# Patient Record
Sex: Female | Born: 2000 | Hispanic: Yes | Marital: Single | Smoking: Never smoker
Health system: Southern US, Community
[De-identification: ages and names within clinical notes are randomized; demographics above are authoritative.]

---
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
## 2024-12-01 ENCOUNTER — Ambulatory Visit

## 2024-12-01 DIAGNOSIS — L700 Acne vulgaris: Secondary | ICD-10-CM

## 2024-12-01 MED ORDER — CLINDAMYCIN PHOS-BENZOYL PEROX 1-5 % EX GEL: CUTANEOUS | 5 refills | Status: AC

## 2024-12-01 MED ORDER — DOXYCYCLINE MONOHYDRATE 100 MG PO TABS: 100.0000 mg | ORAL_TABLET | Freq: Two times a day (BID) | ORAL | 2 refills | Status: AC

## 2024-12-01 MED ORDER — ADAPALENE 0.1 % EX GEL: Freq: Every day | CUTANEOUS | 5 refills | Status: AC

## 2024-12-01 NOTE — Patient Instructions (Addendum)
 Plan for Acne  In the morning: - Cleanse face with a gentle cleanser OR benzoyl peroxide wash if instructed to use this at your visit(can be purchased over the counter- examples at the bottom) - Wipe face with clindamycin  wipe if prescribed at your visit or use benzaclin. This can be used on entire face/chest/back or as a spot treatment to active acne areas  - Apply an oil-free moisturizer  In the evening: - Cleanse face with a regular gentle face wash - Wait for skin to completely dry - Apply a pea sized amount of your retinoid (tretinoin or adapalene ) to your finger. Dot this around your face, and then rub in - If your skin gets dry, you can follow this up with an oil-free moisturizer  If you were instructed to take minocycline or doxycycline . This is the antibiotic we talked about that you will take twice per day for a 3 month course. Take the medication with food, and don't lie down right after taking it, because it can give you heart burn if you lie down right away. You should not use this medicatio nif pregnant or breastfeeding.   How to use your Acne creams  Some creams for acne PREVENT acne and some creams TARGET pimples you can see.  Antibiotic creams (erythromycin, benzoyl peroxide, clindamycin  and sulfur sulfacetamide)  How to apply: generally applied once daily either as a spot treatment or all over the face (see above)  Retinoids (differin /adapalene , retin-A/tretinoin or tazorac) work by PREVENTING acne.  These medicines are good to control acne, but may cause dryness and increase your risk of sunburn.  Do not use if you are PREGNANT or BREAST FEEDING. It takes 4-6 weeks to have results and the acne may worsen in the beginning.  Some retinoids are stronger than others, but are also more irritating. These are prescription topical medications, used to treat acne, sun damage, fine wrinkles, and several other skin changes on the face. Medications in this family include  tretinoin/Retin-A, adapalene /Differin , and Tazorac, among others.   A Note on Cosmetic Use: - Please note, if you are over the age of 15, insurance will typically not cover these medications. If they are recommended to you for non-medically necessary reasons, you may choose to pay for the medication out of pocket. Prices vary, but a tube of generic tretinoin can often be purchased for ~$60-100 (this size often lasts several months). This varies considerably, and we recommended that you check www.goodrx.com for prescription discount coupons and to compare prices across pharmacies.   How to apply: Use at night time (sunlight makes them inactive). If you wash your face at night, let the skin dry for 20 minutes before applying. Apply to all areas that have breakouts (NOT just to pimples you see). Use a PEA-SIZED amount for the entire face (no more) Dot it on your skin and connect the dots Avoid eyes and lips These may cause irritation in the beginning. To decrease irritation, do the following: 1st month: Use twice a week for the first month  2nd month: Use every other night 3rd month and on: Use every night  Cleansing your skin: -   Do not use harsh acne soaps and astringents. - Use water to clean your face.  - If you wear make-up, sunscreen or creams, use non-comedogenic (non-pore clogging) gentle moisturizing wash or cream. A good cleanser removes dirt, oil, and makeup without stripping your skins natural barrier. Look for gentle, fragrance-free formulas. Recommend products like Cetaphil, CeraVe, La  Roche-Posay, Neutrogena, Avene.  Cetaphil Gentle Skin Cleanser - classic sensitive skin favorite (~$6-$12) CeraVe Hydrating Facial Cleanser - hydrating with ceramides (~$6-$11) La Roche Posay Toleriane Hydrating Gentle Cleanser - creamy soothing option (~$14-$16) Neutrogena Ultra Gentle Daily Cleanser - very gentle option (~$9-$11) Vanicream Gentle Facial Cleanser - great budget sensitive  choice (~$9-$13) Avene Cleanance HYRA Cleanser ($20-30)   Tips: Massage gently with lukewarm water, rinse well, and pat dry before moisturizing.  Moisturizers and sunscreen: Apply a non-comedogenic (non-pore clogging) lotion with sunscreen in the morning. You may need to re-apply during the day. Hydrating moisturizers help support your skins barrier and reduce dryness or irritation.Look for gentle, fragrance-free, non comedogenic formulas.  Neutrogena Hydro Boost Water Gel - lightweight hydrating gel (~$19) Neutrogena Hydro Boost Water Cream - richer hydration (~$19-$25) Avne Tolerance Hydra-10 Hydrating Cream - very sensitive skin (+48h hydration) (~$26-$28) Avne Tolerance Control Soothing Skin Recovery Balm - soothing balm for irritated/dry skin (~$38) Avne Cicalfate+ Restorative Protective Cream - excellent for barrier support (~$26)    If you have dryness or irritation, try these tips: Decrease use to every other night or twice weekly as tolerated  Wash the retinoid off after 1 hour and apply a non-comedogenic (non-pore-clogging) lotion If dryness or irritation continues, stop using the retinoid.   Benzoyl peroxide washes 3-5% (brand name includes Neutragena Clear Pore, CereVe Acne Foaming Cream Cleanser, Differin  Cleanser) that you use in the shower. Can bleach linens (clothes, towels, etc), will NOT bleach your skin or hair). Dry off with a white towel so it doesn't take the color out of clothing and colored towels.      Other over the counter acne options - always try prescription products first and add in if not too drying   CeraVe SA Cleanser - salicylic acid face cleanser with ceramides; gentle daily use for bumpy, rough, acne-prone skin  La Roche-Posay Effaclar Salicylic Acid Acne Treatment Serum - 1.5% BHA serum that targets acne blemishes, pores, and post-acne marks with a triple acid complex (salicylic + glycolic + LHA) and soothing thermal water; start once daily  then increase as tolerated.  The Ordinary Mandelic Acid 10% + HA - classic 10% mandelic acid for gentle exfoliation, smooth texture, and brightness  Naturium Mandelic Topical Acid 12% - mandelic acid + fruit AHAs with niacinamide for dark spots & texture  Isotretinoin (oral)  Pronunciation:  EYE so TRET i noyn Brand:  Absorica, Amnesteem, Claravis, Myorisan, Sotret, Zenatane  What is the most important information I should know about isotretinoin? Isotretinoin in just a single dose can cause severe birth defects or death of a baby. Never use this medicine if you are pregnant or may become pregnant. You must have a negative pregnancy test before taking isotretinoin. You will also be required to use two forms of birth control to prevent pregnancy while taking this medicine. Stop using isotretinoin and call your doctor at once if you think you might be pregnant.  What is isotretinoin? Isotretinoin is a form of vitamin A. It reduces the amount of oil released by oil glands in your skin, and helps your skin renew itself more quickly. Isotretinoin is used to treat severe nodular acne that has not responded to other treatments, including antibiotics. Isotretinoin is available only from a certified pharmacy under a special program called iPLEDGE. Isotretinoin may also be used for purposes not listed in this medication guide.  What should I discuss with my healthcare provider before taking isotretinoin? Isotretinoin can cause miscarriage, premature birth,  severe birth defects, or death of a baby if the mother takes this medicine at the time of conception or during pregnancy. Even one dose of isotretinoin can cause major birth defects of the baby's ears, eyes, face, skull, heart, and brain. Never use isotretinoin if you are pregnant. For Women: Unless you have had your uterus and ovaries removed (total hysterectomy) or have been in menopause for at least 12 months in a row, you are considered to be of  child-bearing potential. You must have a negative pregnancy test before you start taking isotretinoin, before each prescription is refilled, right after you take your last dose of isotretinoin, and again 30 days later. All pregnancy testing is required by the [facility] program. You must agree in writing to use two specific forms of birth control beginning 30 days before you start taking isotretinoin and ending 30 days after your last dose. Both a primary and a secondary form of birth control must be used together.  Primary forms of birth control include: tubal ligation (tubes tied); vasectomy of the female sexual partner; an IUD (intrauterine device); estrogen-containing birth control pills (not mini-pills); and hormonal birth control patches, implants, injections, or vaginal ring. Secondary forms of birth control include: a female latex condom with or without spermicide; a diaphragm plus a spermicide; a cervical cap plus a spermicide; and a vaginal sponge containing a spermicide.  Not having sexual intercourse (abstinence) is the most effective method of preventing pregnancy. Stop using isotretinoin and call your doctor at once if you have unprotected sex, if you quit using birth control, if your period is late, or if you think you might be pregnant. If you get pregnant while taking isotretinoin, call the iPLEDGE pregnancy registry at ([phone]. You should not use isotretinoin if you are allergic to it. Tell your doctor if you have ever had: depression or mental illness; asthma; liver disease; diabetes; heart disease or high cholesterol; osteoporosis or low bone mineral density; an eating disorder such as anorexia; a food or drug allergy; or an intestinal disorder such as inflammatory bowel disease or ulcerative colitis.  It is dangerous to try and purchase isotretinoin on the Internet or from vendors outside of the [place] . The sale and distribution of isotretinoin outside of the  iPLEDGE program violates the regulations of the [place].S. Food and Drug Administration for the safe use of this medication. You should not breast-feed while using this medicine. Isotretinoin is not approved for use by anyone younger than 22 years old.  How should I take isotretinoin? Follow all directions on your prescription label and read all medication guides or instruction sheets. Use the medicine exactly as directed. Each prescription of isotretinoin must be filled within 7 days of the date it was written by your doctor. You will receive no more than a 30-day supply of isotretinoin at one time. Always take isotretinoin with a full glass of water. Do not chew or suck on the capsule. Swallow it whole. Follow all directions about taking isotretinoin with or without food. Use this medicine for the full prescribed length of time. Your acne may seem to get worse at first, but should then begin to improve. You may need frequent blood tests.  Never share this medicine with another person, even if they have the same symptoms you have.  Store at room temperature away from moisture, heat, and light.  What happens if I miss a dose? Skip the missed dose and use your next dose at the regular time. Do not  use two doses at one time.  What happens if I overdose? Seek emergency medical attention or call the Poison Help line at [phone]. Overdose symptoms may include headache, dizziness, vomiting, stomach pain, warmth or tingling in your face, swollen or cracked lips, and loss of balance or coordination.  What should I avoid while taking isotretinoin? Do not take a vitamin or mineral supplement that contains vitamin A.  Do not donate blood while taking isotretinoin and for at least 30 days after you stop taking it.  Donated blood that is later given to a pregnant woman could lead to birth defects in her baby if the blood contains any level of isotretinoin. While you are taking isotretinoin and for at  least 6 months after your last dose: Do not use wax hair removers or have dermabrasion or laser skin treatments. Scarring may result. Isotretinoin could make you sunburn more easily. Avoid sunlight or tanning beds. Wear protective clothing and use sunscreen (SPF 30 or higher) when you are outdoors. Avoid driving or hazardous activity until you know how this medicine will affect you. Isotretinoin may impair your vision, especially at night.  What are the possible side effects of isotretinoin? Get emergency medical help if you have signs of an allergic reaction (hives, difficult breathing, swelling in your face or throat) or a severe skin reaction (fever, sore throat, burning eyes, skin pain, red or purple skin rash with blistering and peeling). Stop using isotretinoin and call your doctor at once if you have: problems with your vision or hearing; hallucinations, (see or hearing things that are not real), thoughts about suicide or hurting yourself; depressed mood, crying spells, changes in behavior, feeling angry or irritable; loss of interest in things you enjoyed before, feeling hopeless or guilty; sleep problems, extreme tiredness, trouble concentrating; changes in weight or appetite; a seizure (convulsions), sudden numbness or weakness; muscle weakness, pain in your bones or joints or in your back; severe diarrhea, rectal bleeding, bloody or tarry stools; pale skin, feeling light-headed or short of breath; severe stomach or chest pain, pain when swallowing; or dark urine, or jaundice (yellowing of your skin or eyes). Common side effects may include: dryness of your skin, lips, eyes, or nose (you may have nosebleeds). This is not a complete list of side effects and others may occur. Call your doctor for medical advice about side effects. You may report side effects to FDA at 1-800-FDA-1088.  What other drugs will affect isotretinoin? Tell your doctor about all your other medicines,  especially: phenytoin; St. John's wort; vitamin or mineral supplements; progestin-only birth control pills (mini-pills); steroid medicine; or a tetracycline antibiotic, including doxycycline  or minocycline. This list is not complete. Other drugs may affect isotretinoin, including prescription and over-the-counter medicines, vitamins, and herbal products. Not all possible drug interactions are listed here.  Where can I get more information? Your pharmacist can provide more information about isotretinoin. Remember, keep this and all other medicines out of the reach of children, never share your medicines with others, and use this medication only for the indication prescribed. Every effort has been made to ensure that the information provided by [employer] ('[employer]') is accurate, up-to-date, and complete, but no guarantee is made to that effect. Drug information contained herein may be time sensitive. [employer] information has been compiled for use by healthcare practitioners and consumers in the [place]  and therefore [employer] does not warrant that uses outside of the [place]  are appropriate, unless specifically indicated otherwise. [employer]'s drug information  does not endorse drugs, diagnose patients or recommend therapy. [employer]'s drug information is an investment banker, corporate to assist licensed healthcare practitioners in caring for their patients and/or to serve consumers viewing this service as a supplement to, and not a substitute for, the expertise, skill, knowledge and judgment of healthcare practitioners. The absence of a warning for a given drug or drug combination in no way should be construed to indicate that the drug or drug combination is safe, effective or appropriate for any given patient. [employer] does not assume any responsibility for any aspect of healthcare administered with the aid of information [employer] provides. The information contained herein is not intended to  cover all possible uses, directions, precautions, warnings, drug interactions, allergic reactions, or adverse effects. If you have questions about the drugs you are taking, check with your doctor, nurse or pharmacist.  Copyright [phone] [employer] Version: 11.01. Revision date: 04/30/2017. Care instructions adapted under license by [facility]. If you have questions about a medical condition or this instruction, always ask your healthcare professional. [employer] disclaims any warranty or liability for your use of this information.   Coping with the Dryness from Accutane  Dry eyes? Use preservative-free eye drops  Dry nose or bloody nose (due to dry nose)? Use saline nasal spray (NOT afrin-type products, saline only)  Dry face? Look for oil-free and non-comedogenic (means does not clog pores) lotions to moisturize acne-prone areas. Neutrogena, Aveeno, Oil of Olay. Sunscreen: Neutrogena Clear Face  Dry lips? Throughout the day and at bedtime, apply a lip balm that contains petrolatum/petroleum jelly or dimethicone. NOT chapstick, eos, burt's bees. Plain Vaseline (petroleum jelly) Aquaphor ointment Vaniply ointment Fix My Skin healing lip balm Cerave healing ointment  Dry skin on arms and legs?  Recommended body washes: Dove Sensitive Skin Nourishing Body Wash Unscented Aveeno Active Naturals Skin Relief Body Wash, Fragrance Free Cerave Hydrating Cleanser Olay Ultra Moisture Body Wash/ Olay Sensitive Body Wash Free & Clear (vanicream) liquid cleanser  Moisturizer:  Ointments and creams are thicker and thus provide better moisturization.   Recommended ointments: greasy, but do the best job at [employer] (vanicream) ointment ([employer], Therapist, Occupational, [employer]) Plain Vaseline (petroleum jelly) Aquaphor Healing Ointment Cerave Healing ointment  Recommended creams: Vanicream cream ([employer], Therapist, Occupational,  [employer]) Cetaphil Moisturizing Cream CeraVe Moisturizing Cream Aveeno Baby Eczema Therapy Fragrance Free Moisturizing Cream (even if not a baby!) Aveeno skin relief overnight cream Eucerin Body Creme Eczema Relief Eucerin Original Healing Soothing Repair Cream Eucerin Professional Repair intensive repair cream Gold Bond Diabetics Dry Skin relief hand or foot cream  Gold Bond eczema relief cream (not lotion)  You can use eczema creams even if you do not have eczema. This just means they are more moisturizing.  Lotions (come in a pump) are the weakest moisturizers.       Due to recent changes in healthcare laws, you may see results of your pathology and/or laboratory studies on MyChart before the doctors have had a chance to review them. We understand that in some cases there may be results that are confusing or concerning to you. Please understand that not all results are received at the same time and often the doctors may need to interpret multiple results in order to provide you with the best plan of care or course of treatment. Therefore, we ask that you please give us  2 business days to thoroughly review all your results before contacting the office for clarification. Should we see  a critical lab result, you will be contacted sooner.   If You Need Anything After Your Visit  If you have any questions or concerns for your doctor, please call our main line at [phone] and press option 4 to reach your doctor's medical assistant. If no one answers, please leave a voicemail as directed and we will return your call as soon as possible. Messages left after 4 pm will be answered the following business day.   You may also send us  a message via MyChart. We typically respond to MyChart messages within 1-2 business days.  For prescription refills, please ask your pharmacy to contact our office. Our fax number is [phone].  If you have an urgent issue when the clinic is closed that cannot  wait until the next business day, you can page your doctor at the number below.    Please note that while we do our best to be available for urgent issues outside of office hours, we are not available 24/7.   If you have an urgent issue and are unable to reach us , you may choose to seek medical care at your doctor's office, retail clinic, urgent care center, or emergency room.  If you have a medical emergency, please immediately call 911 or go to the emergency department.  Pager Numbers  - Dr. Hester: [phone]  - Dr. Jackquline: [phone]  - Dr. Claudene: [phone]   - Dr. Raymund: [phone]  In the event of inclement weather, please call our main line at [phone] for an update on the status of any delays or closures.  Dermatology Medication Tips: Please keep the boxes that topical medications come in in order to help keep track of the instructions about where and how to use these. Pharmacies typically print the medication instructions only on the boxes and not directly on the medication tubes.   If your medication is too expensive, please contact our office at [phone] option 4 or send us  a message through MyChart.   We are unable to tell what your co-pay for medications will be in advance as this is different depending on your insurance coverage. However, we may be able to find a substitute medication at lower cost or fill out paperwork to get insurance to cover a needed medication.   If a prior authorization is required to get your medication covered by your insurance company, please allow us  1-2 business days to complete this process.  Drug prices often vary depending on where the prescription is filled and some pharmacies may offer cheaper prices.  The website www.goodrx.com contains coupons for medications through different pharmacies. The prices here do not account for what the cost may be with help from insurance (it may be cheaper with your insurance), but the  website can give you the price if you did not use any insurance.  - You can print the associated coupon and take it with your prescription to the pharmacy.  - You may also stop by our office during regular business hours and pick up a GoodRx coupon card.  - If you need your prescription sent electronically to a different pharmacy, notify our office through [facility] or by phone at [phone] option 4.     Si Usted Necesita Algo Despus de Su Visita  Tambin puede enviarnos un mensaje a travs de Clinical Cytogeneticist. Por lo general respondemos a los mensajes de MyChart en el transcurso de 1 a 2 das hbiles.  Para renovar recetas, por favor pida a su farmacia  que se ponga en contacto con nuestra oficina. Randi lakes de fax es [place] [phone].  Si tiene un asunto urgente cuando la clnica est cerrada y que no puede esperar hasta el siguiente da hbil, puede llamar/localizar a su doctor(a) al nmero que aparece a continuacin.   Por favor, tenga en cuenta que aunque hacemos todo lo posible para estar disponibles para asuntos urgentes fuera del horario de [place], no estamos disponibles las 24 horas del da, los 7 [address]  [address] de la [place].   Si tiene un problema urgente y no puede comunicarse con nosotros, puede optar por buscar atencin mdica  en el consultorio de su doctor(a), en una clnica privada, en un centro de atencin urgente o en una sala de emergencias.  Si tiene engineer, drilling, por favor llame inmediatamente al 911 o vaya a la sala de emergencias.  Nmeros de bper  - Dr. Hester: [phone]  - Dra. Jackquline: [phone]  - Dr. Claudene: [phone]  - Dra. Kitts: [phone]  En caso de inclemencias del [place], por favor llame a nuestra lnea principal al [phone] para una actualizacin sobre el estado de cualquier retraso o cierre.  Consejos para la medicacin en dermatologa: Por favor, guarde las cajas en las que vienen los medicamentos de uso tpico para  ayudarle a seguir las instrucciones sobre dnde y cmo usarlos. Las farmacias generalmente imprimen las instrucciones del medicamento slo en las cajas y no directamente en los tubos del [place].   Si su medicamento es muy caro, por favor, pngase en contacto con landry rieger llamando al [phone] y presione la opcin 4 o envenos un mensaje a travs de Clinical Cytogeneticist.   No podemos decirle cul ser su copago por los medicamentos por adelantado ya que esto es diferente dependiendo de la cobertura de su seguro. Sin embargo, es posible que podamos encontrar un medicamento sustituto a audiological scientist un formulario para que el seguro cubra el medicamento que se considera necesario.   Si se requiere una autorizacin previa para que su compaa de seguros cubra su medicamento, por favor permtanos de 1 a 2 das hbiles para completar este proceso.  Los precios de los medicamentos varan con frecuencia dependiendo del environmental consultant de dnde se surte la receta y alguna farmacias pueden ofrecer precios ms baratos.  El sitio web www.goodrx.com tiene cupones para medicamentos de health and safety inspector. Los precios aqu no tienen en cuenta lo que podra costar con la ayuda del seguro (puede ser ms barato con su seguro), pero el sitio web puede darle el precio si no utiliz tourist information centre manager.  - Puede imprimir el cupn correspondiente y llevarlo con su receta a la farmacia.  - Tambin puede pasar por nuestra oficina durante el horario de atencin regular y education officer, museum una tarjeta de cupones de GoodRx.  - Si necesita que su receta se enve electrnicamente a una farmacia diferente, informe a nuestra oficina a travs de [facility] [place] o por telfono llamando al [phone] y presione la opcin 4.

## 2024-12-01 NOTE — Progress Notes (Signed)
" °  °  Subjective   Emma Hardin is a 24 y.o. female who presents for the following: Acne. Patient is new patient  Today patient reports: Acne on the face and back; currently using OTC acne treatment and Benzaclin.   Review of Systems:    No other skin or systemic complaints except as noted in HPI or Assessment and Plan.  The following portions of the chart were reviewed this encounter and updated as appropriate: medications, allergies, medical history  Relevant Medical History:  n/a   Objective  (SKPE) Well appearing patient in no apparent distress; mood and affect are within normal limits. Examination was performed of the: Focused Exam of: face and back   Examination notable for: Acne vulgaris: Scattered open and closed comedones on the face and back. Red, inflammatory papules and pustules on face and back       Assessment & Plan  (SKAP)   Acne vulgaris - moderate, comedonal, inflammatory, and hormonal - Chronic and persistent condition with duration or expected duration over one year. Condition is symptomatic and bothersome to patient. Patient is flaring and not currently at treatment goal.  - Discussed various treatment options with patient, as well as need for consistent use for at least 6-12 weeks for full efficacy.  - Reviewed treatment options, including side effects of topical agents, oral antibiotics, OCPs (if female), oral spironolactone (if female), and isotretinoin. Discussed that isotretinoin is the most effective  - After discussion opted to initiate: adapalene  0.1% gel in the evening. Educated patient about proper use and potential side effects, including dryness, irritation, sun sensitivity, and transient worsening of acne. - Start benzoyl peroxide 5% - clindamycin  1% gel in the morning for active areas. Educated patient about proper use and potential side effects, including dryness, irritation, and bleaching of fabrics. - Start doxycycline  100mg  twice daily x3  months  - Discussed side effects and precautions with doxycycline  including taking with meal, waiting at least 30 minutes before lying down at night, increased sun sensitivity, and to stop medication if becomes pregnant or breastfeeding - Discussed future need for Isotretinoin if not responsive to initial treatment       Was sun protection counseling provided?: Yes   Level of service outlined above   Patient instructions (SKPI)   Procedures, orders, diagnosis for this visit:    There are no diagnoses linked to this encounter.  Return to clinic: Return in about 3 months (around 03/01/2025) for Acne.  I, Emerick Ege, CMA am acting as scribe for Lauraine JAYSON Kanaris, MD.   Documentation: I have reviewed the above documentation for accuracy and completeness, and I agree with the above.  Lauraine JAYSON Kanaris, MD  "

## 2024-12-11 ENCOUNTER — Telehealth: Payer: Self-pay

## 2024-12-11 MED ORDER — DOXYCYCLINE HYCLATE 100 MG PO CAPS: ORAL_CAPSULE | ORAL | 3 refills | Status: AC

## 2024-12-11 NOTE — Telephone Encounter (Signed)
 Change of Doxycycline  due to insurance formulary. aw

## 2025-03-02 ENCOUNTER — Ambulatory Visit
